# Patient Record
Sex: Female | Born: 1995 | Race: Black or African American | Hispanic: No | Marital: Single | Smoking: Never smoker
Health system: Southern US, Community
[De-identification: ages and names within clinical notes are randomized; demographics above are authoritative.]

---
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
## 2014-03-03 ENCOUNTER — Encounter (HOSPITAL_COMMUNITY): Payer: Self-pay

## 2014-03-03 ENCOUNTER — Emergency Department (INDEPENDENT_AMBULATORY_CARE_PROVIDER_SITE_OTHER)
Admission: EM | Admit: 2014-03-03 | Discharge: 2014-03-03 | Disposition: A | Discharge status: Home / Self Care | Payer: 59 | Source: Home / Self Care | Attending: Family Medicine | Admitting: Family Medicine

## 2014-03-03 DIAGNOSIS — H6502 Acute serous otitis media, left ear: Secondary | ICD-10-CM

## 2014-03-03 DIAGNOSIS — J029 Acute pharyngitis, unspecified: Secondary | ICD-10-CM

## 2014-03-03 MED ORDER — CEFDINIR 300 MG PO CAPS: 300.0000 mg | ORAL_CAPSULE | Freq: Two times a day (BID) | ORAL | Status: AC

## 2014-03-03 NOTE — ED Provider Notes (Signed)
CSN: [id number]     Arrival date & time 03/03/14  1849 History   First MD Initiated Contact with Patient 03/03/14 1923     Chief Complaint  Patient presents with  . Otalgia   (Consider location/radiation/quality/duration/timing/severity/associated sxs/prior Treatment) Patient is a 18 y.o. female presenting with ear pain. The history is provided by the patient.  Otalgia Location:  Left Quality:  Throbbing and pressure Severity:  Moderate Onset quality:  Sudden Duration:  5 days Progression:  Worsening Chronicity:  New Associated symptoms: congestion, cough and sore throat   Associated symptoms: no ear discharge and no fever     History reviewed. No pertinent past medical history. History reviewed. No pertinent past surgical history. History reviewed. No pertinent family history. History  Substance Use Topics  . Smoking status: Never Smoker   . Smokeless tobacco: Not on file  . Alcohol Use: Not on file   OB History    No data available     Review of Systems  Constitutional: Negative.  Negative for fever.  HENT: Positive for congestion, ear pain, postnasal drip and sore throat. Negative for ear discharge.   Respiratory: Positive for cough.   Gastrointestinal: Negative.     Allergies  Review of patient's allergies indicates not on file.  Home Medications   Prior to Admission medications   Medication Sig Start Date End Date Taking? Authorizing Provider  cefdinir (OMNICEF) 300 MG capsule Take 1 capsule (300 mg total) by mouth 2 (two) times daily. 03/03/14   Linna HoffJames D Keanna Tugwell, MD   BP 119/81 mmHg  Pulse 96  Temp(Src) 98.6 F (37 C) (Oral)  Resp 16  SpO2 100% Physical Exam  Constitutional: She is oriented to person, place, and time. She appears well-developed and well-nourished. No distress.  HENT:  Right Ear: Tympanic membrane, external ear and ear canal normal.  Left Ear: External ear and ear canal normal. Tympanic membrane is erythematous and bulging. Tympanic  membrane mobility is abnormal.  Mouth/Throat: Oropharyngeal exudate present.  Eyes: Conjunctivae are normal. Pupils are equal, round, and reactive to light.  Neck: Normal range of motion. Neck supple.  Cardiovascular: Normal heart sounds.   Pulmonary/Chest: Breath sounds normal.  Lymphadenopathy:    She has cervical adenopathy.  Neurological: She is alert and oriented to person, place, and time.  Skin: Skin is warm and dry.  Nursing note and vitals reviewed.   ED Course  Procedures (including critical care time) Labs Review Labs Reviewed - No data to display  Imaging Review No results found.   MDM   1. Acute serous otitis media of left ear, recurrence not specified   2. Acute pharyngitis, unspecified pharyngitis type        Linna HoffJames D Ferrell Flam, MD 03/03/14 [id number]

## 2014-03-03 NOTE — Discharge Instructions (Signed)
Take all of medicine , use tylenol or advil for pain and fever as needed, see your doctor in 10 - 14 days for ear recheck  °

## 2014-03-03 NOTE — ED Notes (Signed)
C/o 6 day duration of ear pain. Minimal relief w OTC medications
